# Patient Record
Sex: Male | Born: 1950 | Race: White | Hispanic: No | Marital: Single | Smoking: Current every day smoker
Health system: Southern US, Community
[De-identification: ages and names within clinical notes are randomized; demographics above are authoritative.]

## PROBLEM LIST (undated history)

## (undated) DIAGNOSIS — T7840XA Allergy, unspecified, initial encounter: Secondary | ICD-10-CM

## (undated) HISTORY — DX: Allergy, unspecified, initial encounter: T78.40XA

## (undated) HISTORY — PX: KNEE SURGERY: SHX244

---

## 2000-08-23 ENCOUNTER — Emergency Department (HOSPITAL_COMMUNITY): Admission: EM | Admit: 2000-08-23 | Discharge: 2000-08-23 | Payer: Self-pay | Admitting: Emergency Medicine

## 2000-08-23 ENCOUNTER — Encounter: Payer: Self-pay | Admitting: Emergency Medicine

## 2009-03-07 ENCOUNTER — Emergency Department (HOSPITAL_COMMUNITY): Admission: EM | Admit: 2009-03-07 | Discharge: 2009-03-07 | Payer: Self-pay | Admitting: Emergency Medicine

## 2013-01-19 ENCOUNTER — Encounter (HOSPITAL_COMMUNITY): Payer: Self-pay | Admitting: Emergency Medicine

## 2013-01-19 ENCOUNTER — Emergency Department (HOSPITAL_COMMUNITY)
Admission: EM | Admit: 2013-01-19 | Discharge: 2013-01-19 | Disposition: A | Discharge status: Home / Self Care | Payer: Self-pay | Attending: Emergency Medicine | Admitting: Emergency Medicine

## 2013-01-19 ENCOUNTER — Emergency Department (HOSPITAL_COMMUNITY): Payer: Self-pay

## 2013-01-19 DIAGNOSIS — H9209 Otalgia, unspecified ear: Secondary | ICD-10-CM | POA: Insufficient documentation

## 2013-01-19 DIAGNOSIS — J9801 Acute bronchospasm: Secondary | ICD-10-CM | POA: Insufficient documentation

## 2013-01-19 DIAGNOSIS — J329 Chronic sinusitis, unspecified: Secondary | ICD-10-CM | POA: Insufficient documentation

## 2013-01-19 DIAGNOSIS — R05 Cough: Secondary | ICD-10-CM

## 2013-01-19 DIAGNOSIS — R059 Cough, unspecified: Secondary | ICD-10-CM

## 2013-01-19 DIAGNOSIS — J029 Acute pharyngitis, unspecified: Secondary | ICD-10-CM | POA: Insufficient documentation

## 2013-01-19 DIAGNOSIS — F17200 Nicotine dependence, unspecified, uncomplicated: Secondary | ICD-10-CM | POA: Insufficient documentation

## 2013-01-19 MED ORDER — HYDROCODONE-ACETAMINOPHEN 5-325 MG PO TABS
2.0000 | ORAL_TABLET | Freq: Once | ORAL | Status: AC
  Administered 2013-01-19: 2 via ORAL
  Filled 2013-01-19: qty 2

## 2013-01-19 MED ORDER — LEVOFLOXACIN 500 MG PO TABS: 500.0000 mg | ORAL_TABLET | Freq: Every day | ORAL | Status: AC

## 2013-01-19 MED ORDER — ALBUTEROL SULFATE HFA 108 (90 BASE) MCG/ACT IN AERS: 1.0000 | INHALATION_SPRAY | Freq: Four times a day (QID) | RESPIRATORY_TRACT | Status: AC | PRN

## 2013-01-19 MED ORDER — ALBUTEROL SULFATE HFA 108 (90 BASE) MCG/ACT IN AERS
2.0000 | INHALATION_SPRAY | Freq: Four times a day (QID) | RESPIRATORY_TRACT | Status: DC
  Administered 2013-01-19: 2 via RESPIRATORY_TRACT
  Filled 2013-01-19: qty 6.7

## 2013-01-19 MED ORDER — HYDROCODONE-ACETAMINOPHEN 5-325 MG PO TABS: 2.0000 | ORAL_TABLET | ORAL | Status: AC | PRN

## 2013-01-19 MED ORDER — LEVOFLOXACIN 500 MG PO TABS
500.0000 mg | ORAL_TABLET | Freq: Once | ORAL | Status: AC
  Administered 2013-01-19: 500 mg via ORAL
  Filled 2013-01-19: qty 1

## 2013-01-19 MED ORDER — ALBUTEROL SULFATE (2.5 MG/3ML) 0.083% IN NEBU
2.5000 mg | INHALATION_SOLUTION | RESPIRATORY_TRACT | Status: DC | PRN
  Administered 2013-01-19: 2.5 mg via RESPIRATORY_TRACT
  Filled 2013-01-19: qty 3

## 2013-01-19 MED ORDER — FLUTICASONE PROPIONATE 50 MCG/ACT NA SUSP: NASAL | Status: AC

## 2013-01-19 NOTE — ED Notes (Signed)
Patient transported to X-ray 

## 2013-01-19 NOTE — Discharge Instructions (Signed)
Bronchospasm, Adult A bronchospasm is when the tubes that carry air in and out of your lungs (airwarys) spasm or tighten. During a bronchospasm it is hard to breathe. This is because the airways get smaller. A bronchospasm can be triggered by:  Allergies. These may be to animals, pollen, food, or mold.  Infection. This is a common cause of bronchospasm.  Exercise.  Irritants. These include pollution, cigarette smoke, strong odors, aerosol sprays, and paint fumes.  Weather changes.  Stress.  Being emotional. HOME CARE   Always have a plan for getting help. Know when to call your doctor and local emergency services (911 in the [place] Know where you can get emergency care.  Only take medicines as told by your doctor.  If you were prescribed an inhaler or nebulizer machine, ask your doctor how to use it correctly. Always use a spacer with your inhaler if you were given one.  Stay calm during an attack. Try to relax and breathe more slowly.  Control your home environment:  Change your heating and air conditioning filter at least once a month.  Limit your use of fireplaces and wood stoves.  Do not  smoke. Do not  allow smoking in your home.  Avoid perfumes and fragrances.  Get rid of pests (such as roaches and mice) and their droppings.  Throw away plants if you see mold on them.  Keep your house clean and dust free.  Replace carpet with wood, tile, or vinyl flooring. Carpet can trap dander and dust.  Use allergy-proof pillows, mattress covers, and box spring covers.  Wash bed sheets and blankets every week in hot water. Dry them in a dryer.  Use blankets that are made of polyester or cotton.  Wash hands frequently. GET HELP IF:  You have muscle aches.  You have chest pain.  The thick spit you spit or cough up (sputum) changes from clear or white to yellow, green, gray, or bloody.  The thick spit you spit or cough up gets thicker.  There are problems that may be  related to the medicine you are given such as:  A rash.  Itching.  Swelling.  Trouble breathing. GET HELP RIGHT AWAY IF:  You feel you cannot breathe or catch your breath.  You cannot stop coughing.  Your treatment is not helping you breathe better. MAKE SURE YOU:   Understand these instructions.  Will watch your condition.  Will get help right away if you are not doing well or get worse. Document Released: 10/31/2008 Document Revised: 09/05/2012 Document Reviewed: 06/26/2012 [facility] Patient Information 2014 [employer], [place].  Sinusitis Sinusitis is redness, soreness, and puffiness (inflammation) of the air pockets in the bones of your face (sinuses). The redness, soreness, and puffiness can cause air and mucus to get trapped in your sinuses. This can allow germs to grow and cause an infection.  HOME CARE   Drink enough fluids to keep your pee (urine) clear or pale yellow.  Use a humidifier in your home.  Run a hot shower to create steam in the bathroom. Sit in the bathroom with the door closed. Breathe in the steam 3 4 times a day.  Put a warm, moist washcloth on your face 3 4 times a day, or as told by your doctor.  Use salt water sprays (saline sprays) to wet the thick fluid in your nose. This can help the sinuses drain.  Only take medicine as told by your doctor. GET HELP RIGHT AWAY IF:  Your pain gets worse.  You have very bad headaches.  You are sick to your stomach (nauseous).  You throw up (vomit).  You are very sleepy (drowsy) all the time.  Your face is puffy (swollen).  Your vision changes.  You have a stiff neck.  You have trouble breathing. MAKE SURE YOU:   Understand these instructions.  Will watch your condition.  Will get help right away if you are not doing well or get worse. Document Released: 06/22/2007 Document Revised: 09/28/2011 Document Reviewed: 08/09/2011 [facility] Patient Information 2014 [place], [place].

## 2013-01-19 NOTE — ED Provider Notes (Signed)
CSN: [id number]     Arrival date & time 01/19/13  1817 History   First MD Initiated Contact with Patient 01/19/13 2111     Chief Complaint  Patient presents with  . Shortness of Breath  . Otalgia  . Sore Throat    HPI  Patient with sinus pain and pressure for 4-5 days of nocturnal.  More short of breath with cough lying supine in the last 2 days he presents here.  His smoker.  No formal diagnosis of COPD he takes no medicines.  Fevers no body aches.  History reviewed. No pertinent past medical history. Past Surgical History  Procedure Laterality Date  . Knee surgery     History reviewed. No pertinent family history. History  Substance Use Topics  . Smoking status: Current Every Day Smoker  . Smokeless tobacco: Not on file  . Alcohol Use: No    Review of Systems  Constitutional: Negative for fever, chills, diaphoresis, appetite change and fatigue.  HENT: Positive for congestion and sinus pressure. Negative for mouth sores, sore throat and trouble swallowing.   Eyes: Negative for visual disturbance.  Respiratory: Positive for cough and shortness of breath. Negative for chest tightness and wheezing.   Cardiovascular: Negative for chest pain.  Gastrointestinal: Negative for nausea, vomiting, abdominal pain, diarrhea and abdominal distention.  Endocrine: Negative for polydipsia, polyphagia and polyuria.  Genitourinary: Negative for dysuria, frequency and hematuria.  Musculoskeletal: Negative for gait problem.  Skin: Negative for color change, pallor and rash.  Neurological: Negative for dizziness, syncope, light-headedness and headaches.  Hematological: Does not bruise/bleed easily.  Psychiatric/Behavioral: Negative for behavioral problems and confusion.    Allergies  Review of patient's allergies indicates no known allergies.  Home Medications   Current Outpatient Rx  Name  Route  Sig  Dispense  Refill  . ibuprofen (ADVIL,MOTRIN) 200 MG tablet   Oral   Take 600 mg by  mouth every 6 (six) hours as needed (pain/fever).         . Multiple Vitamin (MULTIVITAMIN WITH MINERALS) TABS tablet   Oral   Take 1 tablet by mouth daily.         Marland Kitchen. Phenylephrine-APAP-Guaifenesin (MUCINEX FAST-MAX COLD & SINUS) 10-650-400 MG/20ML LIQD   Oral   Take 20 mLs by mouth every 4 (four) hours as needed (congestion).         Marland Kitchen. albuterol (PROVENTIL HFA;VENTOLIN HFA) 108 (90 BASE) MCG/ACT inhaler   Inhalation   Inhale 1-2 puffs into the lungs every 6 (six) hours as needed for wheezing.   1 Inhaler   0   . fluticasone (FLONASE) 50 MCG/ACT nasal spray      1 spray each nares bid   10 g   1   . HYDROcodone-acetaminophen (NORCO/VICODIN) 5-325 MG per tablet   Oral   Take 2 tablets by mouth every 4 (four) hours as needed.   10 tablet   0   . levofloxacin (LEVAQUIN) 500 MG tablet   Oral   Take 1 tablet (500 mg total) by mouth daily.   10 tablet   0    BP 157/95  Pulse 80  Temp(Src) 99.6 F (37.6 C) (Oral)  Resp 20  SpO2 97% Physical Exam  Constitutional: He is oriented to person, place, and time. He appears well-developed and well-nourished. No distress.  HENT:  Head: Normocephalic.    Eyes: Conjunctivae are normal. Pupils are equal, round, and reactive to light. No scleral icterus.  Neck: Normal range of motion. Neck supple.  No thyromegaly present.  Cardiovascular: Normal rate and regular rhythm.  Exam reveals no gallop and no friction rub.   No murmur heard. Pulmonary/Chest: Effort normal. No respiratory distress. He has wheezes in the right upper field, the right middle field, the right lower field, the left upper field, the left middle field and the left lower field. He has no rales.  Abdominal: Soft. Bowel sounds are normal. He exhibits no distension. There is no tenderness. There is no rebound.  Musculoskeletal: Normal range of motion.  Neurological: He is alert and oriented to person, place, and time.  Skin: Skin is warm and dry. No rash noted.    Psychiatric: He has a normal mood and affect. His behavior is normal.    ED Course  Procedures (including critical care time) Labs Review Labs Reviewed - No data to display Imaging Review Dg Chest 2 View (if Patient Has Fever And/or Copd)  01/19/2013   CLINICAL DATA:  Shortness of breath. Otalgia. Sore throat. Smoker.  EXAM: CHEST  2 VIEW  COMPARISON:  None.  FINDINGS: The heart size and mediastinal contours are within normal limits. Both lungs are clear. Pulmonary hyperinflation and chronic interstitial prominence is consistent with COPD. Mild thoracic spine degenerative disc disease noted.  IMPRESSION: COPD.  No active disease.   Electronically Signed   By: Myles Rosenthal M.D.   On: 01/19/2013 22:05    EKG Interpretation   None       MDM   1. Sinusitis   2. Cough   3. Bronchospasm    The patient shows COPD but no acute infiltrates or abnormalities on chest x-ray.  Doing great symptomatically after HHN albutero..  Plan treat for sinusitis with Levaquin.  [place] for the headache.  Flonase nasal spray , bronchodilators.    Rolland Porter, MD 01/19/13 2238

## 2013-01-19 NOTE — ED Notes (Signed)
Patient states that he has had SOB and sore throat with ear pain since Monday. The patient is tolerating well

## 2015-03-06 ENCOUNTER — Ambulatory Visit (INDEPENDENT_AMBULATORY_CARE_PROVIDER_SITE_OTHER): Payer: Medicare Other | Admitting: Physician Assistant

## 2015-03-06 VITALS — BP 120/84 | HR 84 | Temp 98.0°F | Resp 18 | Ht 67.0 in | Wt 172.0 lb

## 2015-03-06 DIAGNOSIS — J0110 Acute frontal sinusitis, unspecified: Secondary | ICD-10-CM

## 2015-03-06 MED ORDER — GUAIFENESIN ER 1200 MG PO TB12: 1.0000 | ORAL_TABLET | Freq: Two times a day (BID) | ORAL | Status: AC | PRN

## 2015-03-06 MED ORDER — IPRATROPIUM BROMIDE 0.03 % NA SOLN: 2.0000 | Freq: Two times a day (BID) | NASAL | Status: AC

## 2015-03-06 MED ORDER — PSEUDOEPHEDRINE HCL 60 MG PO TABS: 60.0000 mg | ORAL_TABLET | ORAL | Status: AC | PRN

## 2015-03-06 NOTE — Patient Instructions (Signed)
You have sinus congestion and possibly a sinus infection.  Please take the mucinex twice daily with lots of water, the atrovent nasal spray twice daily, and the sudafed every 8 hours as needed for congestion. If you're not improved by the weekend or start having worse fevers or chills please let us know and we'll start an antibiotic.

## 2015-03-06 NOTE — Progress Notes (Signed)
   Subjective:    Patient ID: Bruce Elliott, male    DOB: August 25, 1950, 65 y.o.   MRN: [id number]  Chief Complaint  Patient presents with  . Sinusitis    sinus pressure/ x 2 days  . Chills    x 2 days   Medications, allergies, past medical history, surgical history, family history, social history and problem list reviewed and updated.  HPI  65 yom. Symptoms started 3-4 days ago with head/sinus congestion. Rhinorrhea. Persistent. Chills yesterday, awoke this am very diaphoretic. Symptoms were gradual onset. Denies abd pain, n/v, diarrhea. Has not checked temp. Lot of sick contacts recently.   No flu vaccine this year.   Review of Systems No cp, sob, neck pain.     Objective:   Physical Exam  Constitutional: He appears well-developed and well-nourished.  Non-toxic appearance. He does not have a sickly appearance. He does not appear ill. No distress.  BP 120/84 mmHg  Pulse 84  Temp(Src) 98 F (36.7 C) (Oral)  Resp 18  Ht  (1.702 m)  Wt 172 lb (78.019 kg)  BMI 26.93 kg/m2  SpO2 98%   HENT:  Right Ear: Tympanic membrane normal.  Left Ear: Tympanic membrane normal.  Nose: Mucosal edema and rhinorrhea present. Right sinus exhibits no maxillary sinus tenderness and no frontal sinus tenderness. Left sinus exhibits no maxillary sinus tenderness and no frontal sinus tenderness.  Mouth/Throat: Uvula is midline, oropharynx is clear and moist and mucous membranes are normal.  Pulmonary/Chest: Effort normal and breath sounds normal.  Lymphadenopathy:       Head (right side): Submandibular adenopathy present. No submental and no tonsillar adenopathy present.       Head (left side): Submandibular adenopathy present. No submental and no tonsillar adenopathy present.    He has no cervical adenopathy.      Assessment & Plan:   Acute frontal sinusitis, recurrence not specified - Plan: ipratropium (ATROVENT) 0.03 % nasal spray, Guaifenesin (MUCINEX MAXIMUM STRENGTH) 1200 MG TB12,  pseudoephedrine (SUDAFED) 60 MG tablet --4 days sinus congestion, doubt bacterial etiology at this time with normal vitals/exam, short course of illness so far --mucinex, atrovent, sudafed with eye on blood pressure --let us know if no relief by early next week or persistent fevers  Donnajean Lopes, PA-C Physician Assistant-Certified [facility]  03/07/2015 5:38 AM

## 2015-03-07 ENCOUNTER — Encounter: Payer: Self-pay | Admitting: Physician Assistant

## 2015-03-08 IMAGING — CR DG CHEST 2V
2 series · 2 of 2 positions shown · non-contrast
Comparison: None.

CLINICAL DATA: Shortness of breath. Otalgia. Sore throat. Smoker.

EXAM:
CHEST  2 VIEW

[w chest pa]
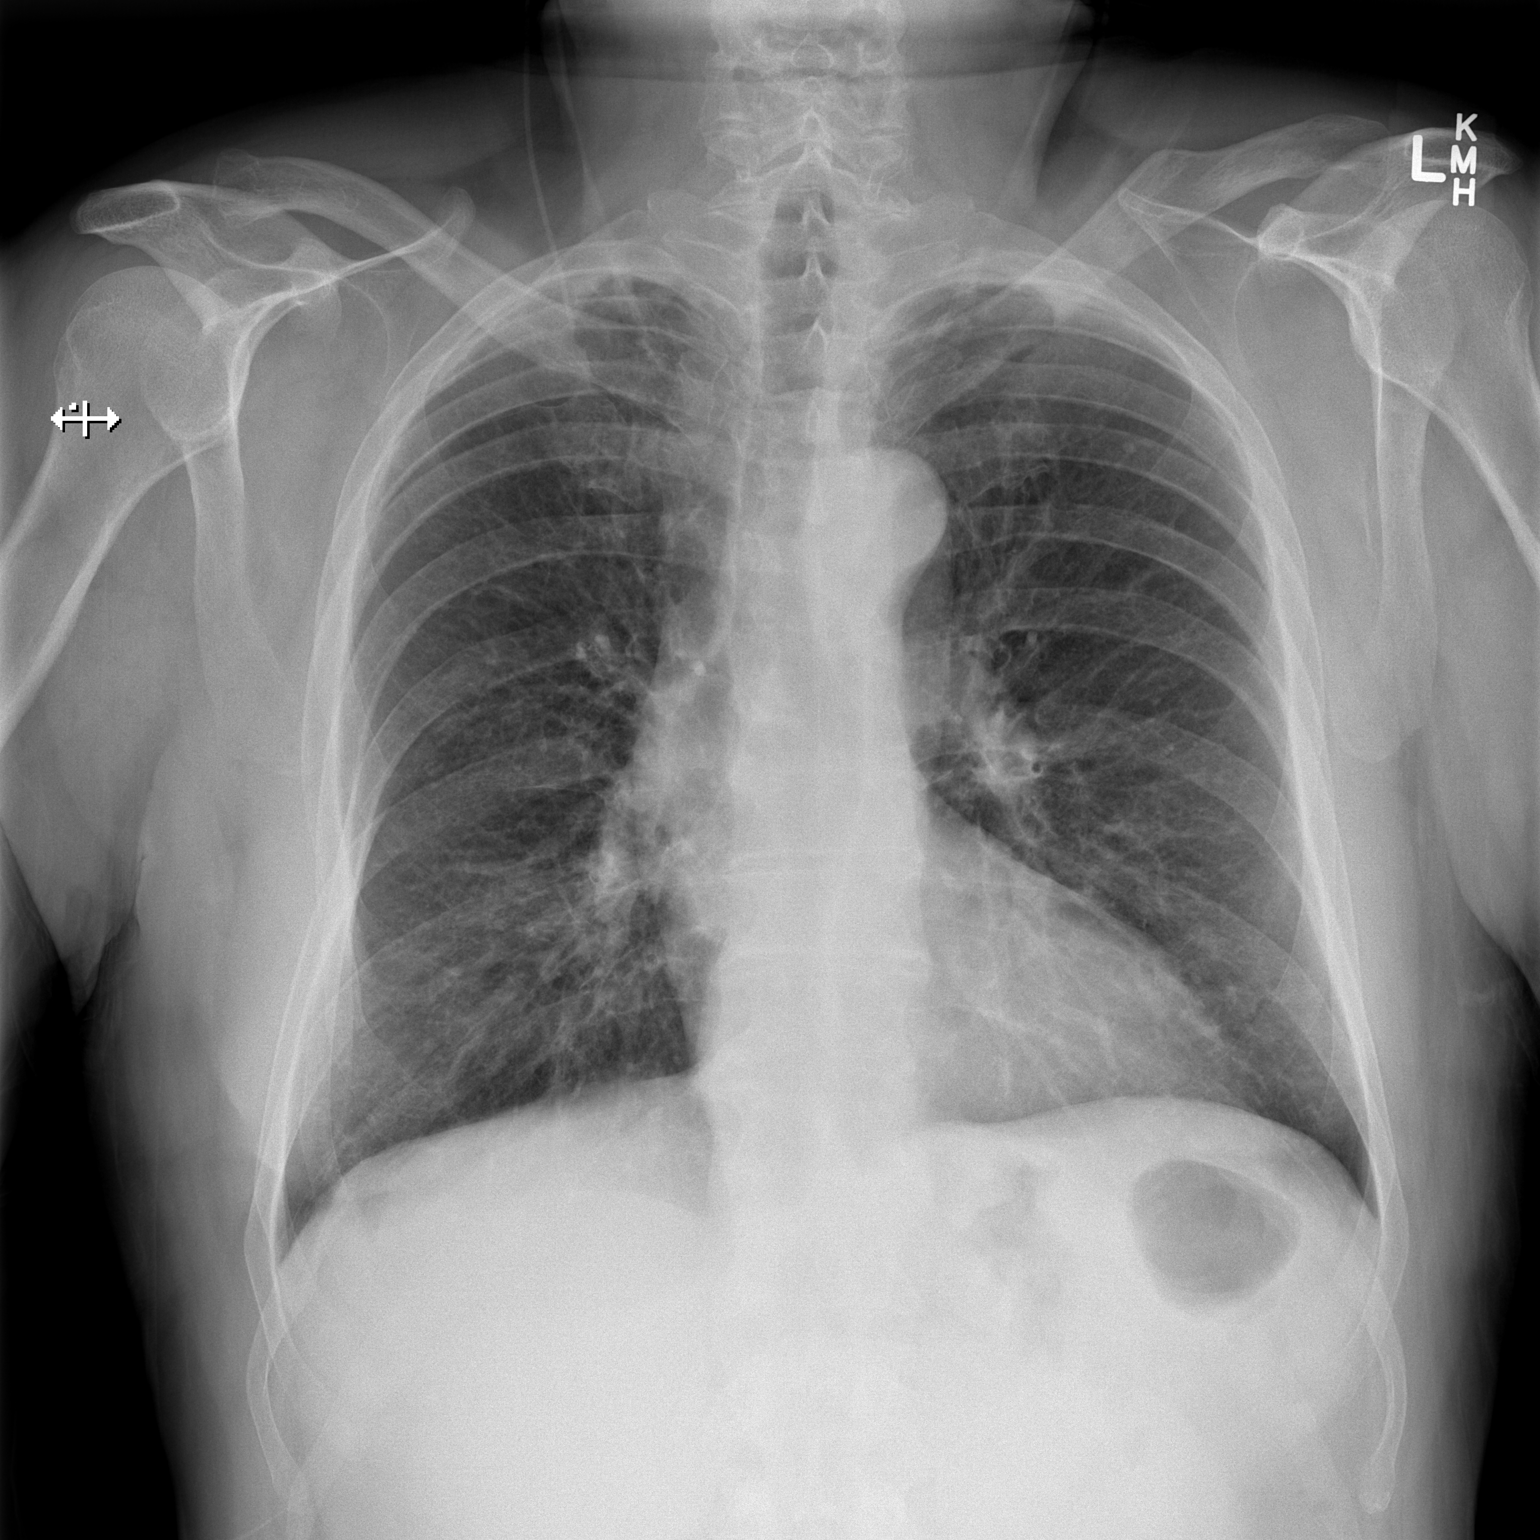

[w chest lat]
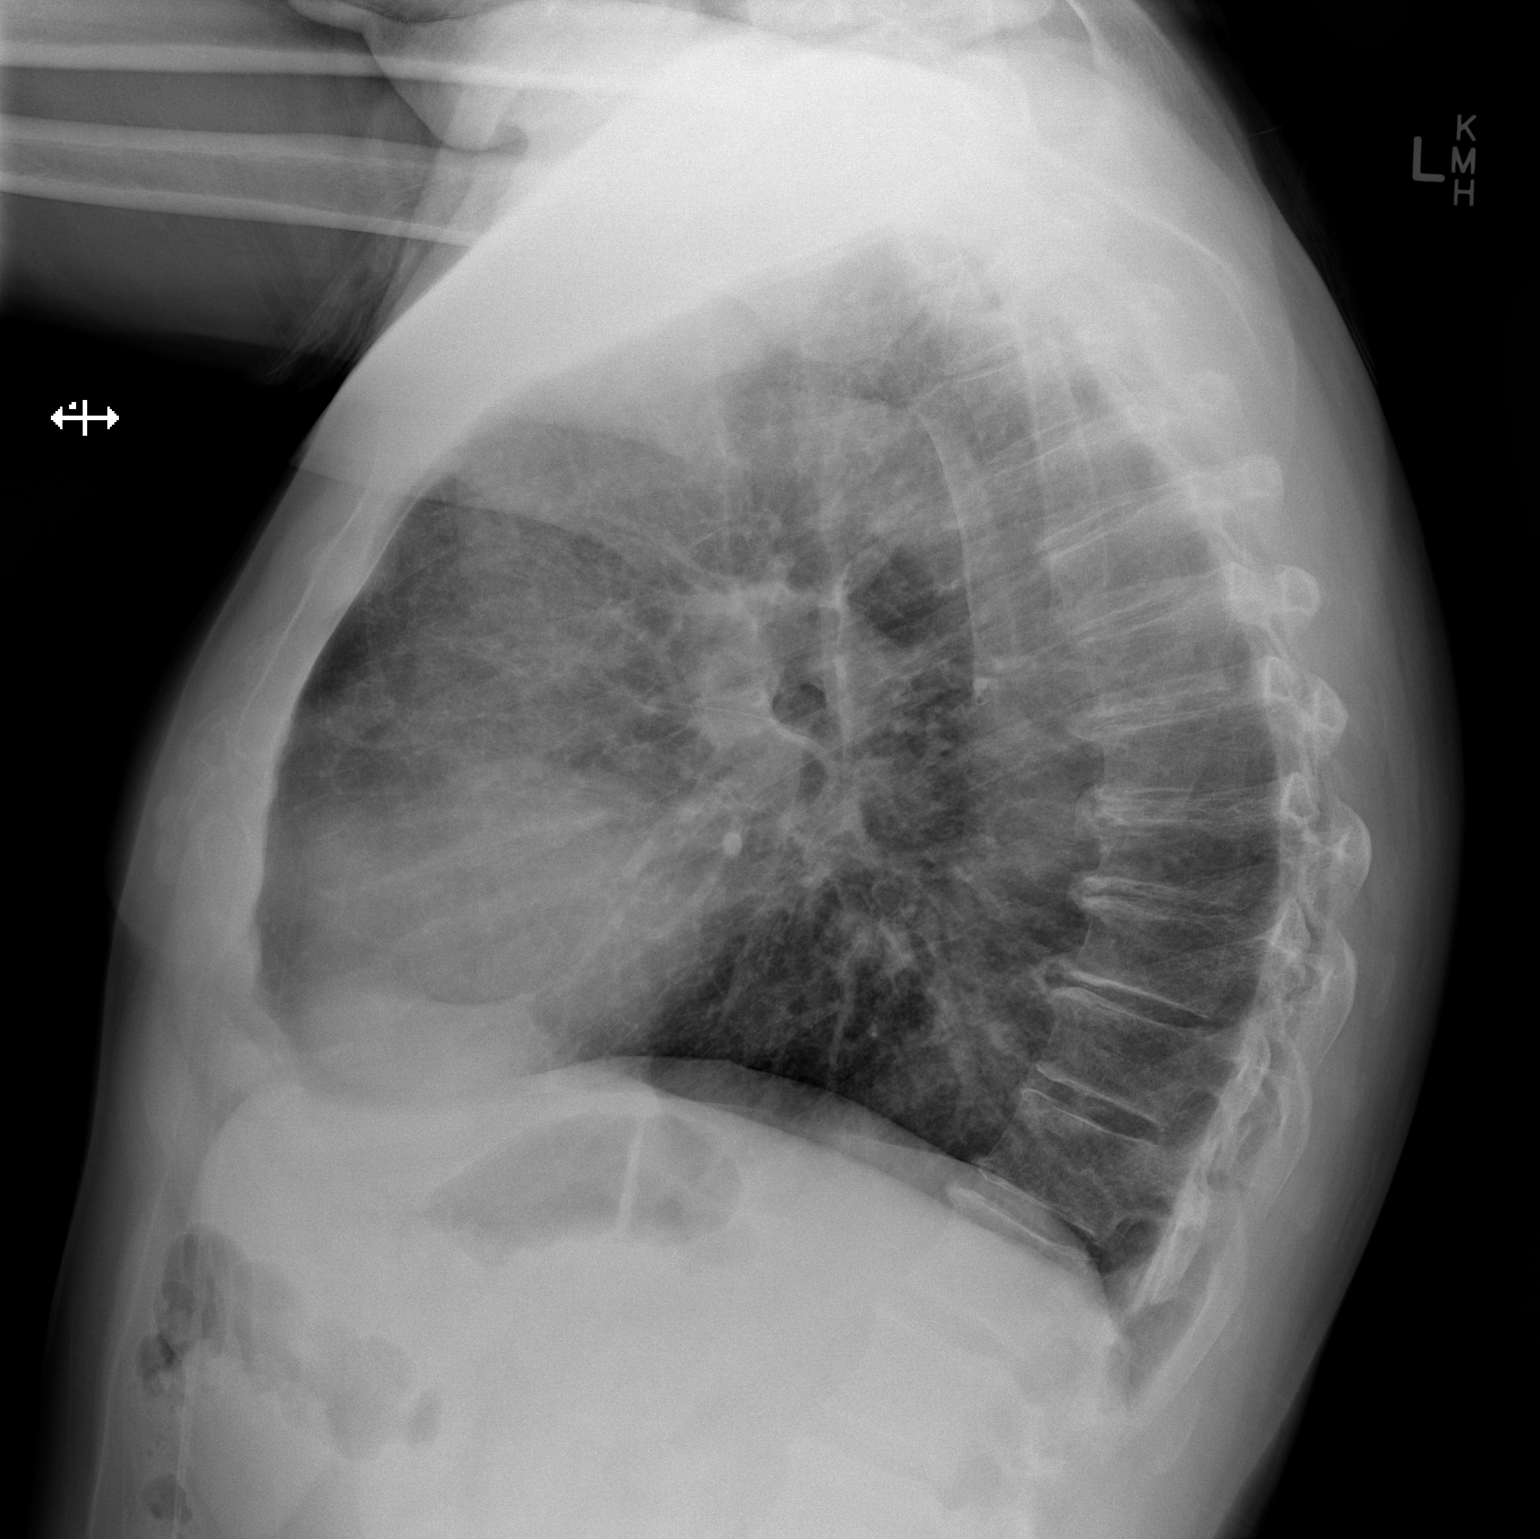

[2 of 2 positions shown; findings below may reference images not displayed]

FINDINGS: The heart size and mediastinal contours are within normal limits.
Both lungs are clear. Pulmonary hyperinflation and chronic
interstitial prominence is consistent with COPD. Mild thoracic spine
degenerative disc disease noted.
IMPRESSION: COPD.  No active disease.

## 2018-06-13 DIAGNOSIS — H524 Presbyopia: Secondary | ICD-10-CM | POA: Diagnosis not present

## 2018-06-13 DIAGNOSIS — H52223 Regular astigmatism, bilateral: Secondary | ICD-10-CM | POA: Diagnosis not present

## 2019-02-14 DIAGNOSIS — Z20822 Contact with and (suspected) exposure to covid-19: Secondary | ICD-10-CM | POA: Diagnosis not present

## 2019-02-14 DIAGNOSIS — Z20828 Contact with and (suspected) exposure to other viral communicable diseases: Secondary | ICD-10-CM | POA: Diagnosis not present

## 2019-08-09 DIAGNOSIS — Z79899 Other long term (current) drug therapy: Secondary | ICD-10-CM | POA: Diagnosis not present

## 2019-08-09 DIAGNOSIS — N400 Enlarged prostate without lower urinary tract symptoms: Secondary | ICD-10-CM | POA: Diagnosis not present

## 2019-08-09 DIAGNOSIS — M549 Dorsalgia, unspecified: Secondary | ICD-10-CM | POA: Diagnosis not present

## 2019-08-09 DIAGNOSIS — F331 Major depressive disorder, recurrent, moderate: Secondary | ICD-10-CM | POA: Diagnosis not present

## 2019-08-09 DIAGNOSIS — F17210 Nicotine dependence, cigarettes, uncomplicated: Secondary | ICD-10-CM | POA: Diagnosis not present

## 2019-08-09 DIAGNOSIS — K4021 Bilateral inguinal hernia, without obstruction or gangrene, recurrent: Secondary | ICD-10-CM | POA: Diagnosis not present

## 2019-08-09 DIAGNOSIS — Z125 Encounter for screening for malignant neoplasm of prostate: Secondary | ICD-10-CM | POA: Diagnosis not present

## 2019-08-09 DIAGNOSIS — E663 Overweight: Secondary | ICD-10-CM | POA: Diagnosis not present

## 2019-08-09 DIAGNOSIS — J449 Chronic obstructive pulmonary disease, unspecified: Secondary | ICD-10-CM | POA: Diagnosis not present

## 2019-08-16 DIAGNOSIS — F330 Major depressive disorder, recurrent, mild: Secondary | ICD-10-CM | POA: Diagnosis not present

## 2019-08-16 DIAGNOSIS — J449 Chronic obstructive pulmonary disease, unspecified: Secondary | ICD-10-CM | POA: Diagnosis not present

## 2019-08-16 DIAGNOSIS — Z008 Encounter for other general examination: Secondary | ICD-10-CM | POA: Diagnosis not present

## 2019-08-16 DIAGNOSIS — Z1211 Encounter for screening for malignant neoplasm of colon: Secondary | ICD-10-CM | POA: Diagnosis not present

## 2019-08-16 DIAGNOSIS — F17210 Nicotine dependence, cigarettes, uncomplicated: Secondary | ICD-10-CM | POA: Diagnosis not present

## 2019-08-16 DIAGNOSIS — E663 Overweight: Secondary | ICD-10-CM | POA: Diagnosis not present

## 2019-08-16 DIAGNOSIS — D509 Iron deficiency anemia, unspecified: Secondary | ICD-10-CM | POA: Diagnosis not present

## 2019-08-16 DIAGNOSIS — Z79899 Other long term (current) drug therapy: Secondary | ICD-10-CM | POA: Diagnosis not present

## 2019-08-16 DIAGNOSIS — Z23 Encounter for immunization: Secondary | ICD-10-CM | POA: Diagnosis not present

## 2019-08-16 DIAGNOSIS — Z0001 Encounter for general adult medical examination with abnormal findings: Secondary | ICD-10-CM | POA: Diagnosis not present
